# Patient Record
Sex: Female | Born: 1950 | Race: White | Hispanic: No | Marital: Married | Smoking: Never smoker
Health system: Southern US, Community
[De-identification: ages and names within clinical notes are randomized; demographics above are authoritative.]

## PROBLEM LIST (undated history)

## (undated) DIAGNOSIS — I639 Cerebral infarction, unspecified: Secondary | ICD-10-CM

## (undated) DIAGNOSIS — R7303 Prediabetes: Secondary | ICD-10-CM

## (undated) DIAGNOSIS — R011 Cardiac murmur, unspecified: Secondary | ICD-10-CM

## (undated) DIAGNOSIS — N1832 Chronic kidney disease, stage 3b: Secondary | ICD-10-CM

## (undated) DIAGNOSIS — N189 Chronic kidney disease, unspecified: Secondary | ICD-10-CM

## (undated) DIAGNOSIS — F32A Depression, unspecified: Secondary | ICD-10-CM

## (undated) DIAGNOSIS — F419 Anxiety disorder, unspecified: Secondary | ICD-10-CM

## (undated) DIAGNOSIS — I10 Essential (primary) hypertension: Secondary | ICD-10-CM

## (undated) DIAGNOSIS — M1990 Unspecified osteoarthritis, unspecified site: Secondary | ICD-10-CM

## (undated) DIAGNOSIS — R112 Nausea with vomiting, unspecified: Secondary | ICD-10-CM

## (undated) HISTORY — PX: REPLACEMENT TOTAL KNEE: SUR1224

---

## 2004-06-23 ENCOUNTER — Ambulatory Visit: Payer: Self-pay | Admitting: Family Medicine

## 2004-07-12 ENCOUNTER — Ambulatory Visit: Payer: Self-pay | Admitting: Family Medicine

## 2005-01-25 ENCOUNTER — Ambulatory Visit: Payer: Self-pay | Admitting: Family Medicine

## 2005-11-02 ENCOUNTER — Ambulatory Visit: Payer: Self-pay | Admitting: Family Medicine

## 2006-01-17 ENCOUNTER — Ambulatory Visit: Payer: Self-pay | Admitting: Family Medicine

## 2007-01-14 ENCOUNTER — Ambulatory Visit: Payer: Self-pay | Admitting: Family Medicine

## 2007-01-18 ENCOUNTER — Ambulatory Visit: Payer: Self-pay | Admitting: Family Medicine

## 2007-08-02 ENCOUNTER — Ambulatory Visit: Payer: Self-pay | Admitting: Family Medicine

## 2008-01-24 HISTORY — PX: CHOLECYSTECTOMY: SHX55

## 2008-04-14 ENCOUNTER — Ambulatory Visit: Payer: Self-pay | Admitting: Family Medicine

## 2008-04-28 ENCOUNTER — Ambulatory Visit: Payer: Self-pay | Admitting: Family Medicine

## 2009-05-31 ENCOUNTER — Ambulatory Visit: Payer: Self-pay | Admitting: Family Medicine

## 2012-01-31 ENCOUNTER — Emergency Department: Payer: Self-pay | Admitting: Emergency Medicine

## 2012-01-31 ENCOUNTER — Ambulatory Visit: Payer: Self-pay | Admitting: Family Medicine

## 2012-01-31 LAB — COMPREHENSIVE METABOLIC PANEL
Alkaline Phosphatase: 84 U/L (ref 50–136)
Bilirubin,Total: 0.3 mg/dL (ref 0.2–1.0)
Calcium, Total: 9 mg/dL (ref 8.5–10.1)
Co2: 29 mmol/L (ref 21–32)
Creatinine: 0.88 mg/dL (ref 0.60–1.30)
EGFR (African American): >60
EGFR (Non-African Amer.): >60
Glucose: 90 mg/dL (ref 65–99)
Osmolality: 282 (ref 275–301)
Potassium: 3.8 mmol/L (ref 3.5–5.1)
SGOT(AST): 42 U/L — ABNORMAL HIGH (ref 15–37)
SGPT (ALT): 51 U/L (ref 12–78)
Sodium: 140 mmol/L (ref 136–145)

## 2012-01-31 LAB — CBC WITH DIFFERENTIAL/PLATELET
Basophil #: 0.1 10*3/uL (ref 0.0–0.1)
Eosinophil #: 0.2 10*3/uL (ref 0.0–0.7)
Eosinophil %: 3.6 %
HGB: 12.1 g/dL (ref 12.0–16.0)
Lymphocyte #: 1.7 10*3/uL (ref 1.0–3.6)
Lymphocyte %: 29.5 %
MCH: 28.3 pg (ref 26.0–34.0)
MCHC: 33.8 g/dL (ref 32.0–36.0)
MCV: 84 fL (ref 80–100)
Monocyte #: 0.5 x10 3/mm (ref 0.2–0.9)
Monocyte %: 8.2 %
Neutrophil #: 3.4 10*3/uL (ref 1.4–6.5)
RBC: 4.28 10*6/uL (ref 3.80–5.20)
RDW: 13.7 % (ref 11.5–14.5)

## 2012-01-31 LAB — TROPONIN I
Troponin-I: <0.02 ng/mL
Troponin-I: <0.02 ng/mL

## 2012-12-22 ENCOUNTER — Ambulatory Visit: Payer: Self-pay | Admitting: Internal Medicine

## 2013-01-23 HISTORY — PX: EYE SURGERY: SHX253

## 2014-01-23 DIAGNOSIS — I639 Cerebral infarction, unspecified: Secondary | ICD-10-CM

## 2014-01-23 HISTORY — DX: Cerebral infarction, unspecified: I63.9

## 2017-01-23 HISTORY — PX: REPLACEMENT TOTAL KNEE: SUR1224

## 2020-09-23 ENCOUNTER — Encounter (INDEPENDENT_AMBULATORY_CARE_PROVIDER_SITE_OTHER): Payer: Self-pay

## 2020-09-23 ENCOUNTER — Emergency Department: Payer: Medicare PPO

## 2020-09-23 ENCOUNTER — Emergency Department
Admission: EM | Admit: 2020-09-23 | Discharge: 2020-09-23 | Disposition: A | Discharge status: Other Acute Inpatient Hospital | Payer: Medicare PPO | Attending: Emergency Medicine | Admitting: Emergency Medicine

## 2020-09-23 DIAGNOSIS — Z7984 Long term (current) use of oral hypoglycemic drugs: Secondary | ICD-10-CM | POA: Insufficient documentation

## 2020-09-23 DIAGNOSIS — I63411 Cerebral infarction due to embolism of right middle cerebral artery: Secondary | ICD-10-CM | POA: Insufficient documentation

## 2020-09-23 DIAGNOSIS — I10 Essential (primary) hypertension: Secondary | ICD-10-CM | POA: Diagnosis not present

## 2020-09-23 DIAGNOSIS — Z20822 Contact with and (suspected) exposure to covid-19: Secondary | ICD-10-CM | POA: Insufficient documentation

## 2020-09-23 DIAGNOSIS — Z79899 Other long term (current) drug therapy: Secondary | ICD-10-CM | POA: Insufficient documentation

## 2020-09-23 DIAGNOSIS — I63511 Cerebral infarction due to unspecified occlusion or stenosis of right middle cerebral artery: Secondary | ICD-10-CM

## 2020-09-23 DIAGNOSIS — R531 Weakness: Secondary | ICD-10-CM | POA: Diagnosis present

## 2020-09-23 HISTORY — DX: Essential (primary) hypertension: I10

## 2020-09-23 HISTORY — DX: Cerebral infarction, unspecified: I63.9

## 2020-09-23 LAB — CBC
HCT: 32.3 % — ABNORMAL LOW (ref 36.0–46.0)
Hemoglobin: 11.2 g/dL — ABNORMAL LOW (ref 12.0–15.0)
MCH: 29.1 pg (ref 26.0–34.0)
MCHC: 34.7 g/dL (ref 30.0–36.0)
MCV: 83.9 fL (ref 80.0–100.0)
Platelets: 319 10*3/uL (ref 150–400)
RBC: 3.85 MIL/uL — ABNORMAL LOW (ref 3.87–5.11)
RDW: 13.8 % (ref 11.5–15.5)
WBC: 9.2 10*3/uL (ref 4.0–10.5)
nRBC: 0 % (ref 0.0–0.2)

## 2020-09-23 LAB — COMPREHENSIVE METABOLIC PANEL
ALT: 26 U/L (ref 0–44)
AST: 25 U/L (ref 15–41)
Albumin: 3.7 g/dL (ref 3.5–5.0)
Alkaline Phosphatase: 55 U/L (ref 38–126)
Anion gap: 10 (ref 5–15)
BUN: 35 mg/dL — ABNORMAL HIGH (ref 8–23)
CO2: 26 mmol/L (ref 22–32)
Calcium: 9.4 mg/dL (ref 8.9–10.3)
Chloride: 95 mmol/L — ABNORMAL LOW (ref 98–111)
Creatinine, Ser: 1.47 mg/dL — ABNORMAL HIGH (ref 0.44–1.00)
GFR, Estimated: 39 mL/min — ABNORMAL LOW (ref >60)
Glucose, Bld: 116 mg/dL — ABNORMAL HIGH (ref 70–99)
Potassium: 3.6 mmol/L (ref 3.5–5.1)
Sodium: 131 mmol/L — ABNORMAL LOW (ref 135–145)
Total Bilirubin: 0.9 mg/dL (ref 0.3–1.2)
Total Protein: 6.7 g/dL (ref 6.5–8.1)

## 2020-09-23 LAB — RESP PANEL BY RT-PCR (FLU A&B, COVID) ARPGX2
Influenza A by PCR: NEGATIVE
Influenza B by PCR: NEGATIVE
SARS Coronavirus 2 by RT PCR: NEGATIVE

## 2020-09-23 LAB — DIFFERENTIAL
Abs Immature Granulocytes: 0.04 10*3/uL (ref 0.00–0.07)
Basophils Absolute: 0 10*3/uL (ref 0.0–0.1)
Basophils Relative: 0 %
Eosinophils Absolute: 0.1 10*3/uL (ref 0.0–0.5)
Eosinophils Relative: 1 %
Immature Granulocytes: 0 %
Lymphocytes Relative: 13 %
Lymphs Abs: 1.2 10*3/uL (ref 0.7–4.0)
Monocytes Absolute: 0.7 10*3/uL (ref 0.1–1.0)
Monocytes Relative: 8 %
Neutro Abs: 7.1 10*3/uL (ref 1.7–7.7)
Neutrophils Relative %: 78 %

## 2020-09-23 LAB — PROTIME-INR
INR: 1 (ref 0.8–1.2)
Prothrombin Time: 13.3 seconds (ref 11.4–15.2)

## 2020-09-23 LAB — APTT: aPTT: 30 seconds (ref 24–36)

## 2020-09-23 MED ORDER — SODIUM CHLORIDE 0.9% FLUSH: 3.0000 mL | Freq: Once | INTRAVENOUS | Status: DC

## 2020-09-23 MED ORDER — LABETALOL HCL 5 MG/ML IV SOLN
10.0000 mg | Freq: Once | INTRAVENOUS | Status: AC
  Administered 2020-09-23: 10 mg via INTRAVENOUS

## 2020-09-23 MED ORDER — LABETALOL HCL 5 MG/ML IV SOLN: 10.0000 mg | Freq: Once | INTRAVENOUS | Status: DC

## 2020-09-23 MED ORDER — TENECTEPLASE FOR STROKE
25.0000 mg | PACK | Freq: Once | INTRAVENOUS | Status: AC
  Administered 2020-09-23: 25 mg via INTRAVENOUS

## 2020-09-23 MED ORDER — IOHEXOL 350 MG/ML SOLN
75.0000 mL | Freq: Once | INTRAVENOUS | Status: AC | PRN
  Administered 2020-09-23: 75 mL via INTRAVENOUS

## 2020-09-23 NOTE — ED Notes (Signed)
Pt transferred to [facility] via [employer].  All belongings given to son.  Paperwork sent w/ transport.  Pt noted to move L arm prior to departure.

## 2020-09-23 NOTE — ED Provider Notes (Signed)
[facility] Emergency Department Provider Note   ____________________________________________    I have reviewed the triage vital signs and the nursing notes.   HISTORY  Chief Complaint Code Stroke     HPI Jeanne Silva is a 70 y.o. female who presents with complaints of left-sided weakness.  This started at approximately 1 PM while she was paying bills at home.  She reports she was recently at [facility] for CVA, no further history is available at this time.  Past Medical History:  Diagnosis Date   Hypertension    Stroke [facility])     There are no problems to display for this patient.   Past Surgical History:  Procedure Laterality Date   REPLACEMENT TOTAL KNEE Right     Prior to Admission medications   Medication Sig Start Date End Date Taking? Authorizing Provider  amLODipine (NORVASC) 5 MG tablet Take 5 mg by mouth 2 (two) times daily. 07/24/20   [provider]  carvedilol (COREG) 25 MG tablet Take 25 mg by mouth 2 (two) times daily with a meal. 07/08/20   [provider]  chlorthalidone (HYGROTON) 50 MG tablet Take 50 mg by mouth daily. 09/03/20   [provider]  cloNIDine (CATAPRES) 0.1 MG tablet Take 0.1-0.2 mg by mouth 2 (two) times daily. 08/23/20   [provider]  cyclobenzaprine (FLEXERIL) 10 MG tablet Take 10 mg by mouth 3 (three) times daily as needed for muscle spasms. 04/01/20   [provider]  fluticasone (FLONASE) 50 MCG/ACT nasal spray Place 2 sprays into both nostrils daily. 04/19/20   [provider]  ketoconazole (NIZORAL) 2 % cream Apply 1 application topically 2 (two) times daily. 09/06/20   [provider]  lisinopril (ZESTRIL) 40 MG tablet Take 40 mg by mouth daily. 09/03/20   [provider]  metFORMIN (GLUCOPHAGE) 500 MG tablet Take 500 mg by mouth 2 (two) times daily with a meal. 08/23/20   [provider]  PRALUENT 75 MG/ML SOAJ  Inject 75 mg into the skin every 14 (fourteen) days. 09/22/20   [provider]     Allergies Septra [sulfamethoxazole-trimethoprim]  No family history on file.  Social History Social History   Tobacco Use   Smoking status: Never   Smokeless tobacco: Never  Substance Use Topics   Alcohol use: Yes    Comment: occ   Drug use: Never    Review of Systems  Constitutional: No fevers reported Eyes: No visual changes.  ENT: No sore throat. Cardiovascular: Denies chest pain. Respiratory: Denies shortness of breath. Gastrointestinal: No abdominal pain Genitourinary: Negative for dysuria. Musculoskeletal: Negative for back pain. Skin: Negative for rash. Neurological: Left-sided weakness  ____________________________________________   PHYSICAL EXAM:  VITAL SIGNS: ED Triage Vitals [09/23/20 1427]  Enc Vitals Group     BP (!) 181/85     Pulse Rate 75     Resp 18     Temp 98 F (36.7 C)     Temp Source Oral     SpO2 100 %     Weight 95.3 kg (210 lb)     Height 1.727 m (5\' 8" )     Head Circumference      Peak Flow      Pain Score 0     Pain Loc      Pain Edu?      Excl. in GC?     Constitutional: Alert and oriented.  Eyes: Conjunctivae are normal.  Head: Atraumatic. Nose: No congestion/rhinnorhea. Mouth/Throat: Mucous membranes are moist.  Left facial droop Neck:  Painless ROM Cardiovascular: Normal rate, regular rhythm.   Good peripheral circulation. Respiratory: Normal respiratory effort.  No retractions Gastrointestinal: Soft and nontender. No distention.   Genitourinary: deferred Musculoskeletal: No apparent injuries Neurologic:  mildly slurred speech NIH stroke scale score of 12, left-sided weakness Skin:  Skin is warm, dry and intact. No rash noted. Psychiatric: Mood and affect are normal. Speech and behavior are normal.  ____________________________________________   LABS (all labs ordered are listed, but only abnormal results are  displayed)  Labs Reviewed  CBC - Abnormal; Notable for the following components:      Result Value   RBC 3.85 (*)    Hemoglobin 11.2 (*)    HCT 32.3 (*)    All other components within normal limits  DIFFERENTIAL  PROTIME-INR  APTT  COMPREHENSIVE METABOLIC PANEL  CBG MONITORING, ED   ____________________________________________  EKG  ED ECG REPORT I, Jene Every, the attending physician, personally viewed and interpreted this ECG.  Date: 09/23/2020  Rhythm: normal sinus rhythm QRS Axis: normal Intervals: normal ST/T Wave abnormalities: normal Narrative Interpretation: no evidence of acute ischemia  ____________________________________________  RADIOLOGY CT head and CT angiography consistent with MCA stroke ____________________________________________   PROCEDURES  Procedure(s) performed: No  Procedures   Critical Care performed: yes  CRITICAL CARE Performed by: Jene Every   Total critical care time: 30 minutes  Critical care time was exclusive of separately billable procedures and treating other patients.  Critical care was necessary to treat or prevent imminent or life-threatening deterioration.  Critical care was time spent personally by me on the following activities: development of treatment plan with patient and/or surrogate as well as nursing, discussions with consultants, evaluation of patient's response to treatment, examination of patient, obtaining history from patient or surrogate, ordering and performing treatments and interventions, ordering and review of laboratory studies, ordering and review of radiographic studies, pulse oximetry and re-evaluation of patient's condition.  ____________________________________________   INITIAL IMPRESSION / ASSESSMENT AND PLAN / ED COURSE  Pertinent labs & imaging results that were available during my care of the patient were reviewed by me and considered in my medical decision making (see chart for  details).   Patient presents with left-sided weakness just prior to arrival, last known well 1 PM, she is well within the window.  Code stroke called.  Patient seen immediately by Dr. Amada Jupiter of neurology  Patient had noted that she had recent CVA at [facility], admitted on August 16, this was precluding giving TN K ,however when the patient's son arrived it was determined that her last CVA was in 2016.  Patient had adamantly requested transfer to [facility] transfer center contacted, Dr. Amada Jupiter spoke with their neuro interventionist  Accepted in transfer via Dr. 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    Jene Every, MD 09/23/20 [phone]

## 2020-09-23 NOTE — ED Notes (Signed)
Pt showing improvement in LLE.

## 2020-09-23 NOTE — ED Notes (Signed)
Patient transported to CT 

## 2020-09-23 NOTE — Consult Note (Signed)
CODE STROKE- PHARMACY COMMUNICATION   Time CODE STROKE called/page received:[phone]  Time response to CODE STROKE was made (in person or via phone): Paged  TNKase given 14:42.   Name of Provider/Nurse contacted:Kirkpatrick MD, Alaina RN.   Past Medical History:  Diagnosis Date   Hypertension    Stroke [facility])    Prior to Admission medications   Medication Sig Start Date End Date Taking? Authorizing Provider  amLODipine (NORVASC) 5 MG tablet Take 5 mg by mouth 2 (two) times daily. 07/24/20   [provider]  carvedilol (COREG) 25 MG tablet Take 25 mg by mouth 2 (two) times daily with a meal. 07/08/20   [provider]  chlorthalidone (HYGROTON) 50 MG tablet Take 50 mg by mouth daily. 09/03/20   [provider]  cloNIDine (CATAPRES) 0.1 MG tablet Take 0.1-0.2 mg by mouth 2 (two) times daily. 08/23/20   [provider]  cyclobenzaprine (FLEXERIL) 10 MG tablet Take 10 mg by mouth 3 (three) times daily as needed for muscle spasms. 04/01/20   [provider]  fluticasone (FLONASE) 50 MCG/ACT nasal spray Place 2 sprays into both nostrils daily. 04/19/20   [provider]  ketoconazole (NIZORAL) 2 % cream Apply 1 application topically 2 (two) times daily. 09/06/20   [provider]  lisinopril (ZESTRIL) 40 MG tablet Take 40 mg by mouth daily. 09/03/20   [provider]  metFORMIN (GLUCOPHAGE) 500 MG tablet Take 500 mg by mouth 2 (two) times daily with a meal. 08/23/20   [provider]  PRALUENT 75 MG/ML SOAJ Inject 75 mg into the skin every 14 (fourteen) days. 09/22/20   [provider]    Ronnald Ramp ,PharmD Clinical Pharmacist  09/23/2020  3:08 PM

## 2020-09-23 NOTE — Consult Note (Signed)
Neurology Consultation Reason for Consult: Left sided weakness Referring Physician: Cyril Loosen, R  CC: Left sided weakness  History is obtained from: Patient  HPI: Jeanne Silva is a 70 y.o. 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 She was inher normal state of health, paying bills, when about 1pm she noticed onset of left sided weakness. She was taken for CT/CTA which showed a large L ICA/MCA occlusion.   She reported to EMS that she had a stroke in "August of '16" which was then reported as Augst 16th, initially making me think that thrombolytic was contraindicated, but on son's arrival he clarified the date and TNK was started after discussing the risks and benefits with the patient. .   LKW: 1pm tnk given?: yes Premorbid modified rankin scale: 1 NIHSS: 12    ROS: A 14 point ROS was performed and is negative except as noted in the HPI.   PMH: Stroke Carotid stenosis DM HTN   FHx: Mother - stroke  Social History: never smoker  Exam: Current vital signs: There were no vitals taken for this visit. Vital signs in last 24 hours:     Physical Exam  Constitutional: Appears well-developed and well-nourished.  Psych: Affect appropriate to situation Eyes: No scleral injection HENT: No OP obstruction MSK: no joint deformities.  Cardiovascular: Normal rate and regular rhythm.  Respiratory: Effort normal, non-labored breathing GI: Soft.  No distension. There is no tenderness.  Skin: WDI  Neuro: Mental Status: Patient is awake, alert, oriented to person, place, month, year, and situation. Patient is able to give a clear and coherent history. No signs of aphasia  She has mild visual neglect Cranial Nerves: II: Visual Fields are full, but she occasionally extinguishes to visual DSS. Pupils are equal, round, and reactive to light.   III,IV, VI: EOMI without ptosis or diploplia.  V: Facial sensation is  diminished on the right.  VII: Facial movement is weak on the left.  Motor: Tone is normal. Bulk is normal. 5/5 strength was present on the right, 2/5 left arm, 3/5 left leg Sensory: Sensation is diminished on left, but she does not extinguish Cerebellar: FNF and HKS are intact on the right.    I have reviewed labs in epic and the results pertinent to this consultation are: Na 131 K 3.6 Cr 1.47 Hgb 11.2 INR 1.0  I have reviewed the images obtained: CT/CTA - ASPECT 9-10, Large ICA/MCA occlusion  Impression: 70 yo F with ICA/MCA occlusion, I suspect thi smay represents ruptured plaque given her history of stenosis, but embolus is possible as well. I recommended transfer to [facility], but the patient requested transfer to [facility]. [facility] has accepted the patient for thrombectomy.   Recommendations: 1) TNK 0.25mg /kg given 2) Maintain BP < 180/100 for 24 hours.  3) no antiplatelets or anticoagulants x 24 hours.  4) transfer to [facility] for thrombectomy, further care per teams there.   This patient is critically ill and at significant risk of neurological worsening, death and care requires constant monitoring of vital signs, hemodynamics,respiratory and cardiac monitoring, neurological assessment, discussion with family, other specialists and medical decision making of high complexity. I spent 65 minutes of neurocritical care time  in the care of  this patient. This was time spent independent of any time provided by nurse practitioner or PA.  Ritta Slot, MD [facility] [phone]  If 7pm- 7am, please page neurology on call as listed in AMION. 09/23/2020  3:41 PM

## 2020-09-23 NOTE — Progress Notes (Signed)
   09/23/20 1420  Clinical Encounter Type  Visited With Patient not available  Visit Type Code  Referral From Chaplain  Consult/Referral To Chaplain  Spiritual Encounters  Spiritual Needs Other (Comment) (not assessed)  Chaplain Burris responded to a Code Stroke. Pt has been taken to CT. Will refer for possible follow-up for needs assessment.

## 2020-09-23 NOTE — ED Triage Notes (Signed)
Arrives via [facility] with c/o 30 minutes history of left sided weakness.  Patient has history of CVA in the past.

## 2020-09-23 NOTE — ED Triage Notes (Addendum)
Pt presents via EMS w/ L sided weakness starting around 1300.  Pt had a previous stroke in 2016.

## 2023-02-17 IMAGING — CT CT ANGIO HEAD-NECK (W OR W/O PERF)
2 of 7 series · 8 of 33 positions shown · IV contrast (APPLIED)
Comparison: CT head 09/23/2020

CLINICAL DATA: Stroke.  Left-sided weakness.

EXAM:
CT ANGIOGRAPHY HEAD AND NECK
TECHNIQUE: Multidetector CT imaging of the head and neck was performed using
the standard protocol during bolus administration of intravenous
contrast. Multiplanar CT image reconstructions and MIPs were
obtained to evaluate the vascular anatomy. Carotid stenosis
measurements (when applicable) are obtained utilizing NASCET
criteria, using the distal internal carotid diameter as the
denominator.
CONTRAST:  75mL OMNIPAQUE IOHEXOL 350 MG/ML SOLN

[Series 4: cta head neck · axial · 0.68mm/px · z∈[+272,+388]mm · 2 of 174 slices shown]
[im 58/174  soft-tissue]
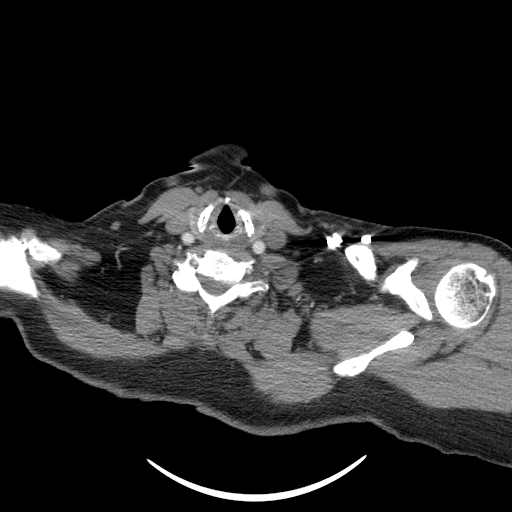
[im 116/174  soft-tissue]
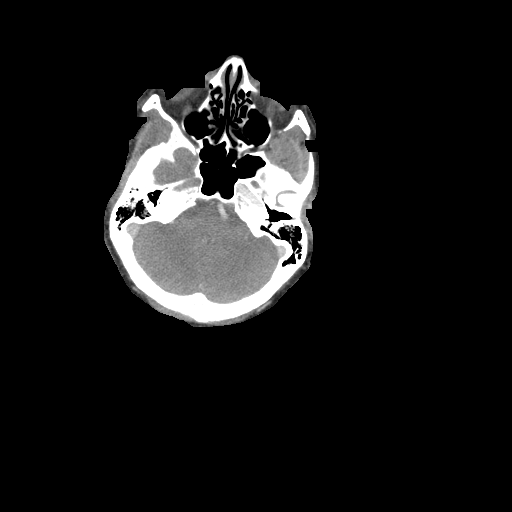

[Series 6: ax thin · axial · 0.49mm/px · z∈[+207,+455]mm · 6 of 348 slices shown]
[im 50/348  soft-tissue]
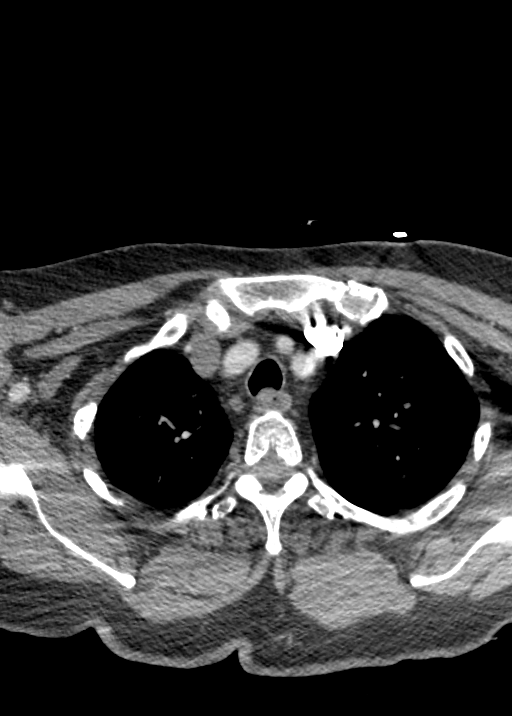
[im 100/348  bone]
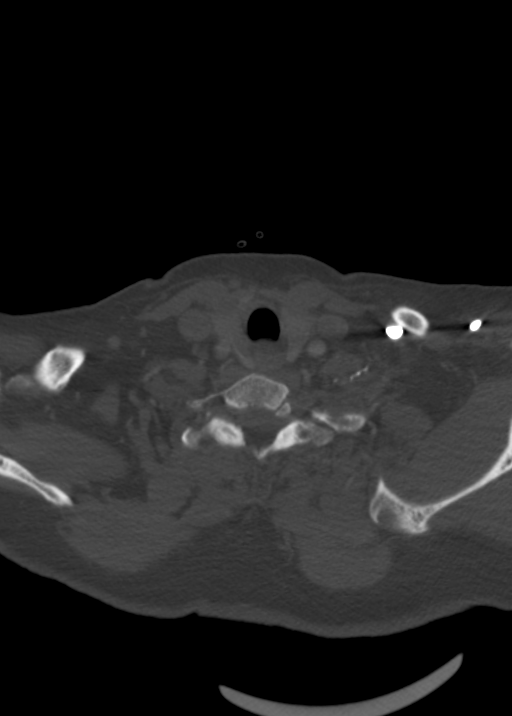
[im 149/348  soft-tissue]
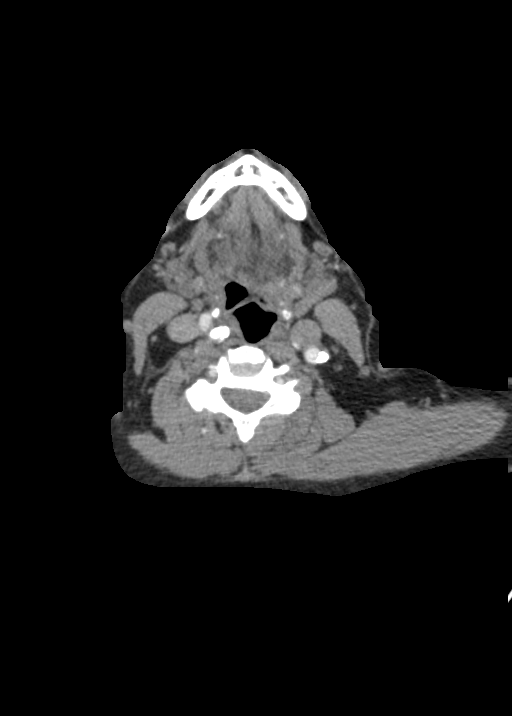
[im 199/348  bone]
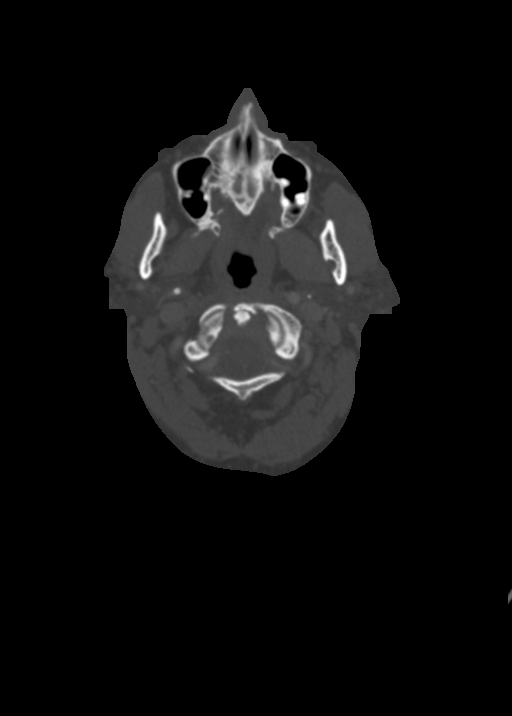
[im 248/348  soft-tissue]
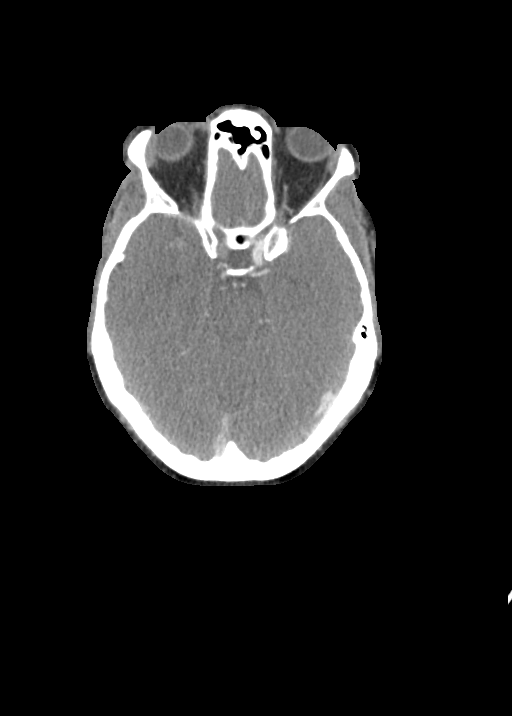
[im 298/348  bone]
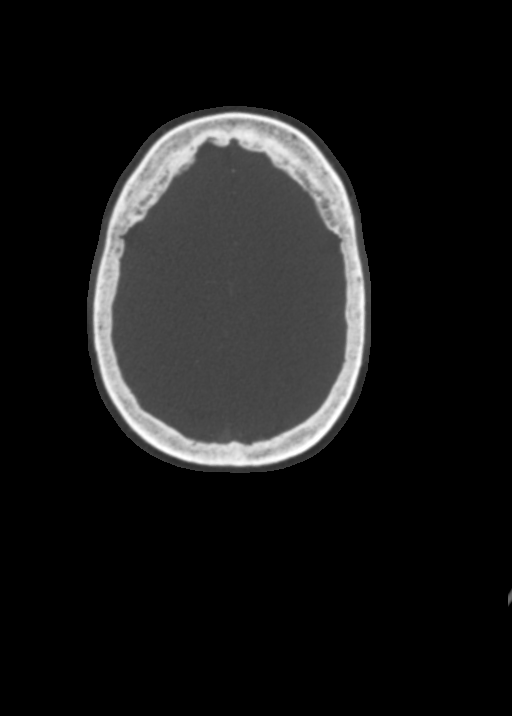

[8 of 33 positions shown; findings below may reference images not displayed]

FINDINGS: CTA NECK FINDINGS

Aortic arch: Standard branching. Imaged portion shows no evidence of
aneurysm or dissection. No significant stenosis of the major arch
vessel origins.

Right carotid system: Right common carotid artery widely patent.
Atherosclerotic disease right carotid bifurcation. Large calcific
plaque proximal right internal carotid artery. Right internal
carotid artery is occluded at the origin and through the skull base.

Left carotid system: Atherosclerotic calcification left carotid
bifurcation without significant stenosis.

Vertebral arteries: Right vertebral artery dominant. Severe stenosis
proximal right vertebral artery which is then patent to the skull
base. Severe calcific stenosis at the level the skull base. Left
vertebral artery is non dominant. Severe atherosclerotic disease
distal left vertebral artery at C1 and C2.

Skeleton: Cervical spondylosis and kyphosis. No acute skeletal
abnormality.

Other neck: Negative for mass or adenopathy in the neck.

Upper chest: Lung apices clear bilaterally.

Review of the MIP images confirms the above findings

CTA HEAD FINDINGS

Anterior circulation: Right internal carotid artery is occluded
through the skull base. There is filling defect in the supraclinoid
internal carotid artery compatible with acute thrombus. There is
also thrombus in the right M1 segment which appears occluded. There
is contrast in branches of the right middle cerebral artery due to
collateral flow with decreased flow. Possible occluded branches
right MCA.

Both anterior cerebral arteries widely patent. Left middle cerebral
artery patent without stenosis.

Posterior circulation: Severe stenosis distal vertebral artery
bilaterally at the skull base. Minimal flow in the distal left
vertebral artery. Right vertebral artery supplies the basilar.
Mild-to-moderate narrowing distal basilar. Posterior cerebral
arteries patent bilaterally with mild atherosclerotic irregularity
on the right. No large vessel occlusion.

Venous sinuses: Normal venous enhancement

Anatomic variants: None

Review of the MIP images confirms the above findings
IMPRESSION: 1. Right internal carotid artery occluded at the origin. Large
calcific plaque proximal right internal carotid artery. There is
thrombus in the supraclinoid internal carotid artery on the right
extending into the right MCA. Decreased flow and right MCA branches
likely with some occluded branches.
2. Left carotid widely patent without stenosis
3. Severe stenosis proximal right vertebral artery which is
dominant. Severe stenosis in the distal vertebral artery
bilaterally. Mild atherosclerotic disease in the posterior cerebral
artery on the right.
pm to provider Milica Mirna via text page

## 2023-07-16 ENCOUNTER — Encounter: Payer: Self-pay | Admitting: Ophthalmology

## 2023-07-17 ENCOUNTER — Encounter: Payer: Self-pay | Admitting: Ophthalmology

## 2023-07-20 NOTE — Discharge Instructions (Signed)

## 2023-07-23 ENCOUNTER — Ambulatory Visit: Payer: Self-pay | Admitting: Anesthesiology

## 2023-07-23 ENCOUNTER — Ambulatory Visit
Admission: RE | Admit: 2023-07-23 | Discharge: 2023-07-23 | Disposition: A | Discharge status: Home / Self Care | Attending: Ophthalmology | Admitting: Ophthalmology

## 2023-07-23 ENCOUNTER — Other Ambulatory Visit: Payer: Self-pay

## 2023-07-23 ENCOUNTER — Encounter
Admission: RE | Disposition: A | Discharge status: Home / Self Care | Payer: Self-pay | Source: Home / Self Care | Attending: Ophthalmology

## 2023-07-23 ENCOUNTER — Encounter: Payer: Self-pay | Admitting: Ophthalmology

## 2023-07-23 DIAGNOSIS — N1832 Chronic kidney disease, stage 3b: Secondary | ICD-10-CM | POA: Diagnosis not present

## 2023-07-23 DIAGNOSIS — H2512 Age-related nuclear cataract, left eye: Secondary | ICD-10-CM | POA: Diagnosis present

## 2023-07-23 DIAGNOSIS — F419 Anxiety disorder, unspecified: Secondary | ICD-10-CM | POA: Insufficient documentation

## 2023-07-23 DIAGNOSIS — M1990 Unspecified osteoarthritis, unspecified site: Secondary | ICD-10-CM | POA: Insufficient documentation

## 2023-07-23 DIAGNOSIS — Z8673 Personal history of transient ischemic attack (TIA), and cerebral infarction without residual deficits: Secondary | ICD-10-CM | POA: Diagnosis not present

## 2023-07-23 DIAGNOSIS — I129 Hypertensive chronic kidney disease with stage 1 through stage 4 chronic kidney disease, or unspecified chronic kidney disease: Secondary | ICD-10-CM | POA: Insufficient documentation

## 2023-07-23 DIAGNOSIS — F32A Depression, unspecified: Secondary | ICD-10-CM | POA: Insufficient documentation

## 2023-07-23 HISTORY — DX: Depression, unspecified: F32.A

## 2023-07-23 HISTORY — PX: CATARACT EXTRACTION W/PHACO: SHX586

## 2023-07-23 HISTORY — DX: Anxiety disorder, unspecified: F41.9

## 2023-07-23 HISTORY — DX: Unspecified osteoarthritis, unspecified site: M19.90

## 2023-07-23 HISTORY — DX: Cardiac murmur, unspecified: R01.1

## 2023-07-23 HISTORY — DX: Chronic kidney disease, unspecified: N18.9

## 2023-07-23 HISTORY — DX: Chronic kidney disease, stage 3b: N18.32

## 2023-07-23 HISTORY — DX: Other specified postprocedural states: R11.2

## 2023-07-23 HISTORY — DX: Prediabetes: R73.03

## 2023-07-23 LAB — GLUCOSE, CAPILLARY: Glucose-Capillary: 112 mg/dL — ABNORMAL HIGH (ref 70–99)

## 2023-07-23 SURGERY — PHACOEMULSIFICATION, CATARACT, WITH IOL INSERTION
Anesthesia: Monitor Anesthesia Care | Laterality: Left

## 2023-07-23 MED ORDER — GLYCOPYRROLATE 0.2 MG/ML IJ SOLN
INTRAMUSCULAR | Status: DC | PRN
  Administered 2023-07-23: .2 mg via INTRAVENOUS

## 2023-07-23 MED ORDER — FENTANYL CITRATE (PF) 100 MCG/2ML IJ SOLN
INTRAMUSCULAR | Status: AC
Start: 2023-07-23 — End: 2023-07-23
  Filled 2023-07-23: qty 2

## 2023-07-23 MED ORDER — EPINEPHRINE PF 1 MG/ML IJ SOLN
INTRAMUSCULAR | Status: DC | PRN
  Administered 2023-07-23: 100 mL via OPHTHALMIC

## 2023-07-23 MED ORDER — MIDAZOLAM HCL 2 MG/2ML IJ SOLN
INTRAMUSCULAR | Status: DC | PRN
  Administered 2023-07-23: 2 mg via INTRAVENOUS

## 2023-07-23 MED ORDER — ARMC OPHTHALMIC DILATING DROPS
OPHTHALMIC | Status: AC
  Filled 2023-07-23: qty 0.5

## 2023-07-23 MED ORDER — PROPOFOL 10 MG/ML IV BOLUS
INTRAVENOUS | Status: AC
Start: 2023-07-23 — End: 2023-07-23
  Filled 2023-07-23: qty 20

## 2023-07-23 MED ORDER — TETRACAINE HCL 0.5 % OP SOLN
1.0000 [drp] | OPHTHALMIC | Status: DC | PRN
  Administered 2023-07-23 (×3): 1 [drp] via OPHTHALMIC

## 2023-07-23 MED ORDER — SIGHTPATH DOSE#1 NA HYALUR & NA CHOND-NA HYALUR IO KIT
PACK | INTRAOCULAR | Status: DC | PRN
  Administered 2023-07-23: 1 via OPHTHALMIC

## 2023-07-23 MED ORDER — MIDAZOLAM HCL 2 MG/2ML IJ SOLN
INTRAMUSCULAR | Status: AC
  Filled 2023-07-23: qty 2

## 2023-07-23 MED ORDER — LACTATED RINGERS IV SOLN: INTRAVENOUS | Status: DC

## 2023-07-23 MED ORDER — ONDANSETRON HCL 4 MG/2ML IJ SOLN
4.0000 mg | Freq: Once | INTRAMUSCULAR | Status: AC
  Administered 2023-07-23: 4 mg via INTRAVENOUS

## 2023-07-23 MED ORDER — MOXIFLOXACIN HCL 0.5 % OP SOLN
OPHTHALMIC | Status: DC | PRN
Start: 2023-07-23 — End: 2023-07-23
  Administered 2023-07-23: .2 mL via OPHTHALMIC

## 2023-07-23 MED ORDER — ARMC OPHTHALMIC DILATING DROPS
1.0000 | OPHTHALMIC | Status: DC | PRN
  Administered 2023-07-23 (×3): 1 via OPHTHALMIC

## 2023-07-23 MED ORDER — FENTANYL CITRATE (PF) 100 MCG/2ML IJ SOLN
INTRAMUSCULAR | Status: DC | PRN
  Administered 2023-07-23: 50 ug via INTRAVENOUS

## 2023-07-23 MED ORDER — SIGHTPATH DOSE#1 BSS IO SOLN
INTRAOCULAR | Status: DC | PRN
  Administered 2023-07-23: 15 mL via INTRAOCULAR

## 2023-07-23 MED ORDER — ONDANSETRON HCL 4 MG/2ML IJ SOLN
INTRAMUSCULAR | Status: AC
  Filled 2023-07-23: qty 2

## 2023-07-23 MED ORDER — LIDOCAINE HCL (PF) 2 % IJ SOLN
INTRAMUSCULAR | Status: DC | PRN
  Administered 2023-07-23: 1 mL via INTRAOCULAR

## 2023-07-23 MED ORDER — TETRACAINE HCL 0.5 % OP SOLN
OPHTHALMIC | Status: AC
  Filled 2023-07-23: qty 4

## 2023-07-23 SURGICAL SUPPLY — 12 items
CATARACT SUITE SIGHTPATH (MISCELLANEOUS) ×1 IMPLANT
DISSECTOR HYDRO NUCLEUS 50X22 (MISCELLANEOUS) ×1 IMPLANT
FEE CATARACT SUITE SIGHTPATH (MISCELLANEOUS) ×1 IMPLANT
GLOVE PI ULTRA LF STRL 7.5 (GLOVE) ×1 IMPLANT
GLOVE SURG POLYISOPRENE 8.5 (GLOVE) ×1 IMPLANT
GLOVE SURG PROTEXIS BL SZ6.5 (GLOVE) ×1 IMPLANT
GLOVE SURG SYN 6.5 PF PI BL (GLOVE) ×1 IMPLANT
GLOVE SURG SYN 8.5 PF PI BL (GLOVE) ×1 IMPLANT
LENS IOL CLRN VT TRC 3 26.5 IMPLANT
NDL FILTER BLUNT 18X1 1/2 (NEEDLE) ×1 IMPLANT
NEEDLE FILTER BLUNT 18X1 1/2 (NEEDLE) ×1 IMPLANT
SYR 3ML LL SCALE MARK (SYRINGE) ×1 IMPLANT

## 2023-07-23 NOTE — Anesthesia Postprocedure Evaluation (Signed)
 Anesthesia Post Note  Patient: ISAAC LACSON  Procedure(s) Performed: PHACOEMULSIFICATION, CATARACT, WITH IOL INSERTION 9.36, 00:48.1 (Left)  Patient location during evaluation: PACU Anesthesia Type: MAC Level of consciousness: awake and alert Pain management: pain level controlled Vital Signs Assessment: post-procedure vital signs reviewed and stable Respiratory status: spontaneous breathing, nonlabored ventilation, respiratory function stable and patient connected to nasal cannula oxygen Cardiovascular status: stable and blood pressure returned to baseline Postop Assessment: no apparent nausea or vomiting Anesthetic complications: no   No notable events documented.   Last Vitals:  Vitals:   07/23/23 1056 07/23/23 1103  BP: 96/63 119/72  Pulse: (!) 58 (!) 50  Resp: 16 12  Temp: 36.6 C   SpO2: 99% 97%    Last Pain:  Vitals:   07/23/23 1103  TempSrc:   PainSc: 0-No pain                 Mitesh Rosendahl C Kayona Foor

## 2023-07-23 NOTE — Transfer of Care (Signed)
 Immediate Anesthesia Transfer of Care Note  Patient: Jeanne Silva  Procedure(s) Performed: PHACOEMULSIFICATION, CATARACT, WITH IOL INSERTION 9.36, 00:48.1 (Left)  Patient Location: PACU  Anesthesia Type: MAC  Level of Consciousness: awake, alert  and patient cooperative  Airway and Oxygen Therapy: Patient Spontanous Breathing and Patient connected to supplemental oxygen  Post-op Assessment: Post-op Vital signs reviewed, Patient's Cardiovascular Status Stable, Respiratory Function Stable, Patent Airway and No signs of Nausea or vomiting  Post-op Vital Signs: Reviewed and stable  Complications: No notable events documented.

## 2023-07-23 NOTE — H&P (Signed)
 [facility]   Primary Care Physician:  Jeanne Rocky Shams, MD Ophthalmologist: Dr. Adine Silva  Pre-Procedure History & Physical: HPI:  Jeanne Silva is a 73 y.o. female here for cataract surgery.   Past Medical History:  Diagnosis Date   Anxiety    Arthritis    Chronic kidney disease    Depression    Heart murmur    Hypertension    PONV (postoperative nausea and vomiting)    Pre-diabetes    Stage 3b chronic kidney disease (CKD) (HCC)    Stroke (HCC) 2016   Stroke (HCC) 2022    Past Surgical History:  Procedure Laterality Date   CHOLECYSTECTOMY  2010   EYE SURGERY  2015   partial detached retnia   REPLACEMENT TOTAL KNEE Right 2019    Prior to Admission medications   Medication Sig Start Date End Date Taking? Authorizing Provider  amLODipine (NORVASC) 5 MG tablet Take 5 mg by mouth 2 (two) times daily. 07/24/20  Yes [provider]  aspirin 81 MG chewable tablet Chew 81 mg by mouth daily.   Yes [provider]  carvedilol (COREG) 25 MG tablet Take 25 mg by mouth 2 (two) times daily with a meal. 07/08/20  Yes [provider]  cloNIDine (CATAPRES) 0.1 MG tablet Take 0.1-0.2 mg by mouth 2 (two) times daily. 08/23/20  Yes [provider]  furosemide (LASIX) 20 MG tablet Take 20 mg by mouth daily.   Yes [provider]  ketoconazole (NIZORAL) 2 % cream Apply 1 application topically 2 (two) times daily. 09/06/20  Yes [provider]  lisinopril (ZESTRIL) 40 MG tablet Take 40 mg by mouth daily. 09/03/20  Yes [provider]  LORazepam (ATIVAN) 0.5 MG tablet Take 0.25 mg by mouth every 8 (eight) hours. As needed bid   Yes [provider]  magnesium oxide (MAG-OX) 400 (240 Mg) MG tablet Take 400 mg by mouth daily.   Yes [provider]  metFORMIN (GLUCOPHAGE) 500 MG tablet Take 500 mg by mouth 2 (two) times daily with a meal. 08/23/20  Yes [provider]  PRALUENT 75 MG/ML SOAJ Inject 75 mg  into the skin every 14 (fourteen) days. 09/22/20  Yes [provider]  spironolactone (ALDACTONE) 25 MG tablet Take 25 mg by mouth daily.   Yes [provider]  traZODone (DESYREL) 50 MG tablet Take 50 mg by mouth at bedtime.   Yes [provider]  chlorthalidone (HYGROTON) 50 MG tablet Take 50 mg by mouth daily. Patient not taking: Reported on 07/16/2023 09/03/20   [provider]  cyclobenzaprine (FLEXERIL) 10 MG tablet Take 10 mg by mouth 3 (three) times daily as needed for muscle spasms. Patient not taking: Reported on 07/16/2023 04/01/20   [provider]  fluticasone (FLONASE) 50 MCG/ACT nasal spray Place 2 sprays into both nostrils daily. Patient not taking: Reported on 07/16/2023 04/19/20   [provider]    Allergies as of 05/24/2023 - Review Complete 09/23/2020  Allergen Reaction Noted   Septra [sulfamethoxazole-trimethoprim] Anaphylaxis 09/23/2020    History reviewed. No pertinent family history.  Social History   Socioeconomic History   Marital status: Married    Spouse name: Not on file   Number of children: Not on file   Years of education: Not on file   Highest education level: Not on file  Occupational History   Not on file  Tobacco Use   Smoking status: Never   Smokeless tobacco: Never  Substance and Sexual Activity   Alcohol use: Yes    Comment: occ   Drug use: Never   Sexual activity: Not on file  Other Topics Concern   Not on file  Social History Narrative   Not on file   Social Drivers of Health   Financial Resource Strain: Low Risk  (04/16/2023)   Received from [facility]   Overall Financial Resource Strain (CARDIA)    Difficulty of Paying Living Expenses: Not hard at all  Food Insecurity: No Food Insecurity (04/16/2023)   Received from [facility]   Hunger Vital Sign    Within the past 12 months, you worried that your food would run out before you got the money  to buy more.: Never true    Within the past 12 months, the food you bought just didn't last and you didn't have money to get more.: Never true  Transportation Needs: No Transportation Needs (04/16/2023)   Received from [facility] - Transportation    In the past 12 months, has lack of transportation kept you from medical appointments or from getting medications?: No    Lack of Transportation (Non-Medical): No  Physical Activity: Sufficiently Active (02/27/2020)   Received from [facility]   Exercise Vital Sign    On average, how many days per week do you engage in moderate to strenuous exercise (like a brisk walk)?: 3 days    On average, how many minutes do you engage in exercise at this level?: 60 min  Stress: No Stress Concern Present (02/27/2020)   Received from [facility] - Occupational Stress Questionnaire    Feeling of Stress : Only a little  Social Connections: Unknown (12/10/2019)   Received from [facility]   Social Connection and Isolation Panel    In a typical week, how many times do you talk on the phone with family, friends, or neighbors?: More than three times a week    How often do you get together with friends or relatives?: More than three times a week    How often do you attend church or religious services?: More than 4 times per year    Active Member of Clubs or Organizations: Not on file    Attends Club or Organization Meetings: Not on file    Are you married, widowed, divorced, separated, never married, or living with a partner?: Married  Intimate Partner Violence: Not At Risk (09/09/2021)   Received from [facility]    Threatened: Not on file    Insulted: Not on file    Physically Hurt : Not on file    Scream: Not on file    Review of Systems: See HPI, otherwise negative ROS  Physical Exam: BP 113/64   Pulse 63   Temp 97.7  F (36.5 C) (Temporal)   Resp 13   Ht 5' 7 (1.702 m)   Wt 73.9 kg   SpO2 98%   BMI 25.53 kg/m  General:   Alert, cooperative. Head:  Normocephalic and atraumatic. Respiratory:  Normal work of breathing. Cardiovascular:  NAD  Impression/Plan: Jeanne Silva is here for cataract surgery.  Risks, benefits, limitations, and alternatives regarding cataract surgery have been reviewed with the patient.  Questions have been answered.  All parties agreeable.   Jeanne Novak, MD  07/23/2023, 10:25 AM

## 2023-07-23 NOTE — Anesthesia Preprocedure Evaluation (Addendum)
 Anesthesia Evaluation  Patient identified by MRN, date of birth, ID band Patient awake    Reviewed: Allergy & Precautions, H&P , NPO status , Patient's Chart, lab work & pertinent test results  Airway Mallampati: III  TM Distance: <3 FB Neck ROM: Full    Dental no notable dental hx.    Pulmonary neg pulmonary ROS   Pulmonary exam normal breath sounds clear to auscultation       Cardiovascular hypertension, negative cardio ROS Normal cardiovascular exam+ Valvular Problems/Murmurs  Rhythm:Regular Rate:Normal     Neuro/Psych  PSYCHIATRIC DISORDERS Anxiety Depression    CVA negative neurological ROS  negative psych ROS   GI/Hepatic negative GI ROS, Neg liver ROS,,,  Endo/Other  negative endocrine ROS    Renal/GU Renal diseasenegative Renal ROS  negative genitourinary   Musculoskeletal negative musculoskeletal ROS (+) Arthritis ,    Abdominal   Peds negative pediatric ROS (+)  Hematology negative hematology ROS (+)   Anesthesia Other Findings Stroke Arthritis Anxiety Depression Hypertension Heart murmur Chronic kidney disease, stage 3b Pre-diabetes   Reproductive/Obstetrics negative OB ROS                             Anesthesia Physical Anesthesia Plan  ASA: 3  Anesthesia Plan: MAC   Post-op Pain Management:    Induction: Intravenous  PONV Risk Score and Plan:   Airway Management Planned: Natural Airway and Nasal Cannula  Additional Equipment:   Intra-op Plan:   Post-operative Plan:   Informed Consent: I have reviewed the patients History and Physical, chart, labs and discussed the procedure including the risks, benefits and alternatives for the proposed anesthesia with the patient or authorized representative who has indicated his/her understanding and acceptance.     Dental Advisory Given  Plan Discussed with: Anesthesiologist, CRNA and Surgeon  Anesthesia Plan  Comments: (Patient consented for risks of anesthesia including but not limited to:  - adverse reactions to medications - damage to eyes, teeth, lips or other oral mucosa - nerve damage due to positioning  - sore throat or hoarseness - Damage to heart, brain, nerves, lungs, other parts of body or loss of life  Patient voiced understanding and assent.)        Anesthesia Quick Evaluation

## 2023-07-23 NOTE — Op Note (Signed)
 OPERATIVE NOTE  Jeanne Silva [id number] 07/23/2023   PREOPERATIVE DIAGNOSIS:  Nuclear sclerotic cataract left eye.  H25.12   POSTOPERATIVE DIAGNOSIS:    Nuclear sclerotic cataract left eye.     PROCEDURE:  Phacoemusification with posterior chamber intraocular lens placement of the left eye   LENS:   Implant Name Type Inv. Item Serial No. Manufacturer Lot No. LRB No. Used Action  LENS IOL CLRN VT TRC 3 26.5 - [id number]  LENS IOL CLRN VT TRC 3 26.5 [id number] [employer]  Left 1 Implanted      Procedure(s): PHACOEMULSIFICATION, CATARACT, WITH IOL INSERTION 9.36, 00:48.1 (Left)  SURGEON:  Adine Novak, MD, MPH   ANESTHESIA:  Topical with tetracaine drops augmented with 1% preservative-free intracameral lidocaine.  ESTIMATED BLOOD LOSS: <1 mL   COMPLICATIONS:  None.   DESCRIPTION OF PROCEDURE:  The patient was identified in the holding room and transported to the operating room and placed in the supine position under the operating microscope.  The left eye was identified as the operative eye and it was prepped and draped in the usual sterile ophthalmic fashion.  The verion system was registered without difficulty.   A 1.0 millimeter clear-corneal paracentesis was made at the 5:00 position. 0.5 ml of preservative-free 1% lidocaine with epinephrine was injected into the anterior chamber.  The anterior chamber was filled with viscoelastic.  A 2.4 millimeter keratome was used to make a near-clear corneal incision at the 2:00 position.  A curvilinear capsulorrhexis was made with a cystotome and capsulorrhexis forceps.  Balanced salt solution was used to hydrodissect and hydrodelineate the nucleus.   Phacoemulsification was then used in stop and chop fashion to remove the lens nucleus and epinucleus.  The remaining cortex was then removed using the irrigation and aspiration handpiece. Viscoelastic was then placed into the capsular bag to distend it for lens placement.  A lens was then  injected into the capsular bag.  The remaining viscoelastic was aspirated.  The lens was rotated with guidance from the verion system.   Wounds were hydrated with balanced salt solution.  The anterior chamber was inflated to a physiologic pressure with balanced salt solution.  Intracameral vigamox 0.1 mL undiltued was injected into the eye and a drop placed onto the ocular surface.  No wound leaks were noted.  The patient was taken to the recovery room in stable condition without complications of anesthesia or surgery  Adine Novak 07/23/2023, 10:53 AM

## 2023-07-30 NOTE — Anesthesia Preprocedure Evaluation (Addendum)
 Anesthesia Evaluation  Patient identified by MRN, date of birth, ID band Patient awake    Reviewed: Allergy & Precautions, H&P , NPO status , Patient's Chart, lab work & pertinent test results  History of Anesthesia Complications (+) PONV and history of anesthetic complications  Airway Mallampati: III  TM Distance: <3 FB Neck ROM: Full    Dental no notable dental hx.    Pulmonary neg pulmonary ROS   Pulmonary exam normal breath sounds clear to auscultation       Cardiovascular hypertension, negative cardio ROS Normal cardiovascular exam+ Valvular Problems/Murmurs  Rhythm:Regular Rate:Normal     Neuro/Psych  PSYCHIATRIC DISORDERS Anxiety Depression    CVA negative neurological ROS  negative psych ROS   GI/Hepatic negative GI ROS, Neg liver ROS,,,  Endo/Other  negative endocrine ROS    Renal/GU Renal diseasenegative Renal ROS  negative genitourinary   Musculoskeletal negative musculoskeletal ROS (+) Arthritis ,    Abdominal   Peds negative pediatric ROS (+)  Hematology negative hematology ROS (+)   Anesthesia Other Findings Previous cataract surgery 07-23-23 Dr. Ola  Hypertension Stroke Heart murmur Chronic kidney disease  Pre-diabetes Arthritis  Depression Anxiety  Stage 3b chronic kidney disease (CKD)  PONV (postoperative nausea and vomiting)     Reproductive/Obstetrics negative OB ROS                              Anesthesia Physical Anesthesia Plan  ASA: 3  Anesthesia Plan: MAC   Post-op Pain Management:    Induction: Intravenous  PONV Risk Score and Plan:   Airway Management Planned: Natural Airway and Nasal Cannula  Additional Equipment:   Intra-op Plan:   Post-operative Plan:   Informed Consent: I have reviewed the patients History and Physical, chart, labs and discussed the procedure including the risks, benefits and alternatives for the proposed  anesthesia with the patient or authorized representative who has indicated his/her understanding and acceptance.     Dental Advisory Given  Plan Discussed with: Anesthesiologist, CRNA and Surgeon  Anesthesia Plan Comments: (Patient consented for risks of anesthesia including but not limited to:  - adverse reactions to medications - damage to eyes, teeth, lips or other oral mucosa - nerve damage due to positioning  - sore throat or hoarseness - Damage to heart, brain, nerves, lungs, other parts of body or loss of life  Patient voiced understanding and assent.)         Anesthesia Quick Evaluation

## 2023-08-03 NOTE — Discharge Instructions (Signed)

## 2023-08-13 ENCOUNTER — Encounter
Admission: RE | Disposition: A | Discharge status: Home / Self Care | Payer: Self-pay | Source: Home / Self Care | Attending: Ophthalmology

## 2023-08-13 ENCOUNTER — Encounter: Payer: Self-pay | Admitting: Ophthalmology

## 2023-08-13 ENCOUNTER — Ambulatory Visit: Payer: Self-pay | Admitting: Anesthesiology

## 2023-08-13 ENCOUNTER — Other Ambulatory Visit: Payer: Self-pay

## 2023-08-13 ENCOUNTER — Ambulatory Visit
Admission: RE | Admit: 2023-08-13 | Discharge: 2023-08-13 | Disposition: A | Discharge status: Home / Self Care | Attending: Ophthalmology | Admitting: Ophthalmology

## 2023-08-13 DIAGNOSIS — R7303 Prediabetes: Secondary | ICD-10-CM | POA: Diagnosis not present

## 2023-08-13 DIAGNOSIS — Z9842 Cataract extraction status, left eye: Secondary | ICD-10-CM | POA: Insufficient documentation

## 2023-08-13 DIAGNOSIS — N1832 Chronic kidney disease, stage 3b: Secondary | ICD-10-CM | POA: Insufficient documentation

## 2023-08-13 DIAGNOSIS — Z961 Presence of intraocular lens: Secondary | ICD-10-CM | POA: Insufficient documentation

## 2023-08-13 DIAGNOSIS — F419 Anxiety disorder, unspecified: Secondary | ICD-10-CM | POA: Diagnosis not present

## 2023-08-13 DIAGNOSIS — I129 Hypertensive chronic kidney disease with stage 1 through stage 4 chronic kidney disease, or unspecified chronic kidney disease: Secondary | ICD-10-CM | POA: Insufficient documentation

## 2023-08-13 DIAGNOSIS — H2511 Age-related nuclear cataract, right eye: Secondary | ICD-10-CM | POA: Insufficient documentation

## 2023-08-13 DIAGNOSIS — F32A Depression, unspecified: Secondary | ICD-10-CM | POA: Insufficient documentation

## 2023-08-13 DIAGNOSIS — M1990 Unspecified osteoarthritis, unspecified site: Secondary | ICD-10-CM | POA: Diagnosis not present

## 2023-08-13 HISTORY — PX: CATARACT EXTRACTION W/PHACO: SHX586

## 2023-08-13 LAB — GLUCOSE, CAPILLARY: Glucose-Capillary: 102 mg/dL — ABNORMAL HIGH (ref 70–99)

## 2023-08-13 SURGERY — PHACOEMULSIFICATION, CATARACT, WITH IOL INSERTION
Anesthesia: Monitor Anesthesia Care | Site: Eye | Laterality: Right

## 2023-08-13 MED ORDER — SIGHTPATH DOSE#1 BSS IO SOLN
INTRAOCULAR | Status: DC | PRN
  Administered 2023-08-13: 15 mL via INTRAOCULAR

## 2023-08-13 MED ORDER — TETRACAINE HCL 0.5 % OP SOLN
OPHTHALMIC | Status: AC
Start: 2023-08-13 — End: 2023-08-13
  Filled 2023-08-13: qty 4

## 2023-08-13 MED ORDER — MIDAZOLAM HCL 2 MG/2ML IJ SOLN
INTRAMUSCULAR | Status: DC | PRN
  Administered 2023-08-13 (×2): 1 mg via INTRAVENOUS

## 2023-08-13 MED ORDER — LACTATED RINGERS IV SOLN: INTRAVENOUS | Status: DC

## 2023-08-13 MED ORDER — ARMC OPHTHALMIC DILATING DROPS
1.0000 | OPHTHALMIC | Status: DC | PRN
  Administered 2023-08-13 (×3): 1 via OPHTHALMIC

## 2023-08-13 MED ORDER — FENTANYL CITRATE (PF) 100 MCG/2ML IJ SOLN
INTRAMUSCULAR | Status: AC
  Filled 2023-08-13: qty 2

## 2023-08-13 MED ORDER — SIGHTPATH DOSE#1 BSS IO SOLN
INTRAOCULAR | Status: DC | PRN
  Administered 2023-08-13: 83 mL via OPHTHALMIC

## 2023-08-13 MED ORDER — ARMC OPHTHALMIC DILATING DROPS
OPHTHALMIC | Status: AC
  Filled 2023-08-13: qty 0.5

## 2023-08-13 MED ORDER — MOXIFLOXACIN HCL 0.5 % OP SOLN
OPHTHALMIC | Status: DC | PRN
Start: 2023-08-13 — End: 2023-08-13
  Administered 2023-08-13: .2 mL via OPHTHALMIC

## 2023-08-13 MED ORDER — LIDOCAINE HCL (PF) 2 % IJ SOLN
INTRAOCULAR | Status: DC | PRN
  Administered 2023-08-13: 4 mL via INTRAOCULAR

## 2023-08-13 MED ORDER — MIDAZOLAM HCL 2 MG/2ML IJ SOLN
INTRAMUSCULAR | Status: AC
  Filled 2023-08-13: qty 2

## 2023-08-13 MED ORDER — TETRACAINE HCL 0.5 % OP SOLN
1.0000 [drp] | OPHTHALMIC | Status: DC | PRN
  Administered 2023-08-13 (×3): 1 [drp] via OPHTHALMIC

## 2023-08-13 MED ORDER — FENTANYL CITRATE (PF) 100 MCG/2ML IJ SOLN
INTRAMUSCULAR | Status: DC | PRN
  Administered 2023-08-13: 50 ug via INTRAVENOUS

## 2023-08-13 MED ORDER — SIGHTPATH DOSE#1 NA HYALUR & NA CHOND-NA HYALUR IO KIT
PACK | INTRAOCULAR | Status: DC | PRN
Start: 2023-08-13 — End: 2023-08-13
  Administered 2023-08-13: 1 via OPHTHALMIC

## 2023-08-13 SURGICAL SUPPLY — 11 items
CATARACT SUITE SIGHTPATH (MISCELLANEOUS) ×1 IMPLANT
DISSECTOR HYDRO NUCLEUS 50X22 (MISCELLANEOUS) ×1 IMPLANT
FEE CATARACT SUITE SIGHTPATH (MISCELLANEOUS) ×1 IMPLANT
GLOVE PI ULTRA LF STRL 7.5 (GLOVE) ×1 IMPLANT
GLOVE SURG SYN 6.5 PF PI BL (GLOVE) ×1 IMPLANT
GLOVE SURG SYN 8.5 PF PI BL (GLOVE) ×1 IMPLANT
LENS IOL CLRN VT YLW 26.5 IMPLANT
NDL FILTER BLUNT 18X1 1/2 (NEEDLE) ×1 IMPLANT
NEEDLE FILTER BLUNT 18X1 1/2 (NEEDLE) ×1 IMPLANT
RING TENSION CAPSULAR (Miscellaneous) IMPLANT
SYR 3ML LL SCALE MARK (SYRINGE) ×1 IMPLANT

## 2023-08-13 NOTE — Transfer of Care (Signed)
 Immediate Anesthesia Transfer of Care Note  Patient: Jeanne Silva  Procedure(s) Performed: PHACOEMULSIFICATION, CATARACT, WITH IOL INSERTION 7.59 00:47.3 (Right: Eye)  Patient Location: PACU  Anesthesia Type: MAC  Level of Consciousness: awake, alert  and patient cooperative  Airway and Oxygen Therapy: Patient Spontanous Breathing and Patient connected to supplemental oxygen  Post-op Assessment: Post-op Vital signs reviewed, Patient's Cardiovascular Status Stable, Respiratory Function Stable, Patent Airway and No signs of Nausea or vomiting  Post-op Vital Signs: Reviewed and stable  Complications: No notable events documented.

## 2023-08-13 NOTE — H&P (Signed)
 [facility]   Primary Care Physician:  Miriam Rocky Shams, MD Ophthalmologist: Dr. Adine Novak  Pre-Procedure History & Physical: HPI:  Jeanne Silva is a 73 y.o. female here for cataract surgery.   Past Medical History:  Diagnosis Date   Anxiety    Arthritis    Chronic kidney disease    Depression    Heart murmur    Hypertension    PONV (postoperative nausea and vomiting)    Pre-diabetes    Stage 3b chronic kidney disease (CKD) (HCC)    Stroke (HCC) 2016   Stroke [facility]) 2022    Past Surgical History:  Procedure Laterality Date   CATARACT EXTRACTION W/PHACO Left 07/23/2023   Procedure: PHACOEMULSIFICATION, CATARACT, WITH IOL INSERTION 9.36, 00:48.1;  Surgeon: Novak Adine Anes, MD;  Location: [facility];  Service: Ophthalmology;  Laterality: Left;   CHOLECYSTECTOMY  2010   EYE SURGERY  2015   partial detached retnia   REPLACEMENT TOTAL KNEE Right 2019    Prior to Admission medications   Medication Sig Start Date End Date Taking? Authorizing Provider  amLODipine (NORVASC) 5 MG tablet Take 5 mg by mouth 2 (two) times daily. 07/24/20  Yes [provider]  aspirin 81 MG chewable tablet Chew 81 mg by mouth daily.   Yes [provider]  carvedilol (COREG) 25 MG tablet Take 25 mg by mouth 2 (two) times daily with a meal. 07/08/20  Yes [provider]  furosemide (LASIX) 20 MG tablet Take 20 mg by mouth daily.   Yes [provider]  ketoconazole (NIZORAL) 2 % cream Apply 1 application topically 2 (two) times daily. 09/06/20  Yes [provider]  lisinopril (ZESTRIL) 40 MG tablet Take 40 mg by mouth daily. 09/03/20  Yes [provider]  LORazepam (ATIVAN) 0.5 MG tablet Take 0.25 mg by mouth every 8 (eight) hours. As needed bid   Yes [provider]  magnesium oxide (MAG-OX) 400 (240 Mg) MG tablet Take 400 mg by mouth daily.   Yes [provider]  metFORMIN (GLUCOPHAGE) 500 MG tablet Take 500 mg  by mouth 2 (two) times daily with a meal. 08/23/20  Yes [provider]  PRALUENT 75 MG/ML SOAJ Inject 75 mg into the skin every 14 (fourteen) days. 09/22/20  Yes [provider]  spironolactone (ALDACTONE) 25 MG tablet Take 25 mg by mouth daily.   Yes [provider]  traZODone (DESYREL) 50 MG tablet Take 50 mg by mouth at bedtime.   Yes [provider]  chlorthalidone (HYGROTON) 50 MG tablet Take 50 mg by mouth daily. Patient not taking: Reported on 07/16/2023 09/03/20   [provider]  cloNIDine (CATAPRES) 0.1 MG tablet Take 0.1-0.2 mg by mouth 2 (two) times daily. 08/23/20   [provider]  cyclobenzaprine (FLEXERIL) 10 MG tablet Take 10 mg by mouth 3 (three) times daily as needed for muscle spasms. Patient not taking: Reported on 07/16/2023 04/01/20   [provider]  fluticasone (FLONASE) 50 MCG/ACT nasal spray Place 2 sprays into both nostrils daily. Patient not taking: Reported on 07/16/2023 04/19/20   [provider]    Allergies as of 05/24/2023 - Review Complete 09/23/2020  Allergen Reaction Noted   Septra [sulfamethoxazole-trimethoprim] Anaphylaxis 09/23/2020    History reviewed. No pertinent family history.  Social History   Socioeconomic History   Marital status: Married    Spouse name: Not on file   Number of children: Not on file   Years of  education: Not on file   Highest education level: Not on file  Occupational History   Not on file  Tobacco Use   Smoking status: Never   Smokeless tobacco: Never  Substance and Sexual Activity   Alcohol use: Yes    Comment: occ   Drug use: Never   Sexual activity: Not on file  Other Topics Concern   Not on file  Social History Narrative   Not on file   Social Drivers of Health   Financial Resource Strain: Low Risk  (04/16/2023)   Received from [facility]   Overall Financial Resource Strain (CARDIA)    Difficulty of Paying Living  Expenses: Not hard at all  Food Insecurity: No Food Insecurity (04/16/2023)   Received from [facility]   Hunger Vital Sign    Within the past 12 months, you worried that your food would run out before you got the money to buy more.: Never true    Within the past 12 months, the food you bought just didn't last and you didn't have money to get more.: Never true  Transportation Needs: No Transportation Needs (04/16/2023)   Received from [facility] - Transportation    In the past 12 months, has lack of transportation kept you from medical appointments or from getting medications?: No    Lack of Transportation (Non-Medical): No  Physical Activity: Sufficiently Active (02/27/2020)   Received from [facility]   Exercise Vital Sign    On average, how many days per week do you engage in moderate to strenuous exercise (like a brisk walk)?: 3 days    On average, how many minutes do you engage in exercise at this level?: 60 min  Stress: No Stress Concern Present (02/27/2020)   Received from [facility] - Occupational Stress Questionnaire    Feeling of Stress : Only a little  Social Connections: Unknown (12/10/2019)   Received from [facility]   Social Connection and Isolation Panel    In a typical week, how many times do you talk on the phone with family, friends, or neighbors?: More than three times a week    How often do you get together with friends or relatives?: More than three times a week    How often do you attend church or religious services?: More than 4 times per year    Active Member of Clubs or Organizations: Not on file    Attends Club or Organization Meetings: Not on file    Are you married, widowed, divorced, separated, never married, or living with a partner?: Married  Intimate Partner Violence: Not At Risk (09/09/2021)   Received from  [facility]    Threatened: Not on file    Insulted: Not on file    Physically Hurt : Not on file    Scream: Not on file    Review of Systems: See HPI, otherwise negative ROS  Physical Exam: BP 129/73   Pulse 63   Temp (!) 97.4 F (36.3 C) (Temporal)   Resp 14   Ht 5' 7 (1.702 m)   Wt 74.8 kg   SpO2 95%   BMI 25.84 kg/m  General:   Alert, cooperative. Head:  Normocephalic and atraumatic. Respiratory:  Normal work of breathing. Cardiovascular:  NAD  Impression/Plan: Jeanne Silva is here for cataract surgery.  Risks, benefits, limitations, and  alternatives regarding cataract surgery have been reviewed with the patient.  Questions have been answered.  All parties agreeable.   Adine Novak, MD  08/13/2023, 10:56 AM

## 2023-08-13 NOTE — Anesthesia Postprocedure Evaluation (Signed)
 Anesthesia Post Note  Patient: Jeanne Silva  Procedure(s) Performed: PHACOEMULSIFICATION, CATARACT, WITH IOL INSERTION 7.59 00:47.3 (Right: Eye)  Patient location during evaluation: PACU Anesthesia Type: MAC Level of consciousness: awake and alert Pain management: pain level controlled Vital Signs Assessment: post-procedure vital signs reviewed and stable Respiratory status: spontaneous breathing, nonlabored ventilation, respiratory function stable and patient connected to nasal cannula oxygen Cardiovascular status: stable and blood pressure returned to baseline Postop Assessment: no apparent nausea or vomiting Anesthetic complications: no   No notable events documented.   Last Vitals:  Vitals:   08/13/23 1128 08/13/23 1136  BP: 110/67 129/79  Pulse: (!) 54 (!) 55  Resp: 15 14  Temp: 36.5 C 36.6 C  SpO2: 100% 98%    Last Pain:  Vitals:   08/13/23 1136  TempSrc:   PainSc: 0-No pain                 Dolphus Linch C Damisha Wolff

## 2023-08-13 NOTE — Op Note (Signed)
 OPERATIVE NOTE  Jeanne Silva [id number] 08/13/2023   PREOPERATIVE DIAGNOSIS:  Nuclear sclerotic cataract right eye.  H25.11   POSTOPERATIVE DIAGNOSIS:    Nuclear sclerotic cataract right eye.     PROCEDURE:  Phacoemusification with posterior chamber intraocular lens placement of the right eye   LENS:   Implant Name Type Inv. Item Serial No. Manufacturer Lot No. LRB No. Used Action  [employer] VIVITY 26.5 Intraocular Lens  [id number] [facility]  Right 1 Implanted  RING TENSION CAPSULAR - [id number] Miscellaneous RING TENSION CAPSULAR [id number] [facility] Right 1 Implanted       Procedure(s): PHACOEMULSIFICATION, CATARACT, WITH IOL INSERTION 7.59 00:47.3 (Right)  SURGEON:  Adine Novak, MD, MPH  ANESTHESIOLOGIST: Anesthesiologist: Ola Donny BROCKS, MD CRNA: Jahoo, Sonia, CRNA   ANESTHESIA:  Topical with tetracaine  drops augmented with 1% preservative-free intracameral lidocaine .  ESTIMATED BLOOD LOSS: less than 1 mL.   COMPLICATIONS:  None.   DESCRIPTION OF PROCEDURE:  The patient was identified in the holding room and transported to the operating room and placed in the supine position under the operating microscope.  The right eye was identified as the operative eye and it was prepped and draped in the usual sterile ophthalmic fashion.   A 1.0 millimeter clear-corneal paracentesis was made at the 10:30 position. 0.5 ml of preservative-free 1% lidocaine  with epinephrine  was injected into the anterior chamber.  The anterior chamber was filled with viscoelastic.  A 2.4 millimeter keratome was used to make a near-clear corneal incision at the 8:00 position.  A curvilinear capsulorrhexis was made with a cystotome and capsulorrhexis forceps.  Balanced salt solution was used to hydrodissect and hydrodelineate the nucleus.   Phacoemulsification was then used in stop and chop fashion to remove the lens nucleus and epinucleus.  The remaining cortex was then removed using the irrigation and  aspiration handpiece. Viscoelastic was then placed into the capsular bag to distend it for lens placement.  A lens was then injected into the capsular bag.    There was some zonular instability, so a CTR was injected without difficulty.  The remaining viscoelastic was aspirated.   Wounds were hydrated with balanced salt solution.  The anterior chamber was inflated to a physiologic pressure with balanced salt solution.    Intracameral vigamox  0.1 mL undiluted was injected into the eye and a drop placed onto the ocular surface.  The lens was well centered.  No wound leaks were noted.  The patient was taken to the recovery room in stable condition without complications of anesthesia or surgery  Adine Novak 08/13/2023, 11:27 AM

## 2023-08-14 ENCOUNTER — Encounter: Payer: Self-pay | Admitting: Ophthalmology
